# Patient Record
Sex: Female | Born: 1965 | Race: Black or African American | Hispanic: No | Marital: Single | Smoking: Never smoker
Health system: Southern US, Community
[De-identification: ages and names within clinical notes are randomized; demographics above are authoritative.]

## PROBLEM LIST (undated history)

## (undated) ENCOUNTER — Emergency Department (HOSPITAL_BASED_OUTPATIENT_CLINIC_OR_DEPARTMENT_OTHER): Admission: EM | Payer: Self-pay | Source: Home / Self Care

## (undated) HISTORY — PX: APPENDECTOMY: SHX54

## (undated) HISTORY — PX: OTHER SURGICAL HISTORY: SHX169

---

## 2012-07-27 ENCOUNTER — Ambulatory Visit: Payer: Self-pay | Admitting: Sports Medicine

## 2012-07-28 ENCOUNTER — Ambulatory Visit: Payer: Self-pay | Admitting: Sports Medicine

## 2012-08-01 ENCOUNTER — Ambulatory Visit: Payer: Self-pay | Admitting: Sports Medicine

## 2012-08-04 ENCOUNTER — Encounter: Payer: Self-pay | Admitting: Sports Medicine

## 2012-08-04 ENCOUNTER — Ambulatory Visit (INDEPENDENT_AMBULATORY_CARE_PROVIDER_SITE_OTHER): Payer: Federal, State, Local not specified - PPO | Admitting: Sports Medicine

## 2012-08-04 VITALS — BP 102/69 | HR 66 | Wt 136.0 lb

## 2012-08-04 DIAGNOSIS — Z01818 Encounter for other preprocedural examination: Secondary | ICD-10-CM

## 2012-08-04 DIAGNOSIS — Z299 Encounter for prophylactic measures, unspecified: Secondary | ICD-10-CM | POA: Insufficient documentation

## 2012-08-04 LAB — CBC
HCT: 37.5 % (ref 36.0–46.0)
Hemoglobin: 13.3 g/dL (ref 12.0–15.0)
MCH: 31.3 pg (ref 26.0–34.0)
MCHC: 35.5 g/dL (ref 30.0–36.0)
MCV: 88.2 fL (ref 78.0–100.0)
Platelets: 309 10*3/uL (ref 150–400)
RBC: 4.25 MIL/uL (ref 3.87–5.11)
RDW: 13.6 % (ref 11.5–15.5)
WBC: 5 10*3/uL (ref 4.0–10.5)

## 2012-08-04 LAB — COMPREHENSIVE METABOLIC PANEL WITH GFR
AST: 20 U/L (ref 0–37)
Alkaline Phosphatase: 47 U/L (ref 39–117)
Glucose, Bld: 79 mg/dL (ref 70–99)
Sodium: 139 meq/L (ref 135–145)
Total Bilirubin: 0.3 mg/dL (ref 0.3–1.2)
Total Protein: 6.9 g/dL (ref 6.0–8.3)

## 2012-08-04 LAB — TSH: TSH: 1.375 u[IU]/mL (ref 0.350–4.500)

## 2012-08-04 LAB — LIPID PANEL
Cholesterol: 190 mg/dL (ref 0–200)
HDL: 65 mg/dL (ref >39)
LDL Cholesterol: 110 mg/dL — ABNORMAL HIGH (ref 0–99)
Total CHOL/HDL Ratio: 2.9 Ratio
Triglycerides: 75 mg/dL (ref <150)
VLDL: 15 mg/dL (ref 0–40)

## 2012-08-04 LAB — COMPREHENSIVE METABOLIC PANEL
ALT: 12 U/L (ref 0–35)
Albumin: 4.3 g/dL (ref 3.5–5.2)
BUN: 10 mg/dL (ref 6–23)
CO2: 30 mEq/L (ref 19–32)
Calcium: 9.9 mg/dL (ref 8.4–10.5)
Chloride: 103 mEq/L (ref 96–112)
Creat: 0.83 mg/dL (ref 0.50–1.10)
Potassium: 4.2 mEq/L (ref 3.5–5.3)

## 2012-08-04 LAB — FOLATE: Folate: >20 ng/mL

## 2012-08-04 LAB — IRON AND TIBC
%SAT: 23 % (ref 20–55)
Iron: 70 ug/dL (ref 42–145)
TIBC: 308 ug/dL (ref 250–470)
UIBC: 238 ug/dL (ref 125–400)

## 2012-08-04 LAB — VITAMIN B12: Vitamin B-12: 660 pg/mL (ref 211–911)

## 2012-08-04 LAB — FERRITIN: Ferritin: 71 ng/mL (ref 10–291)

## 2012-08-04 NOTE — Progress Notes (Signed)
Subjective:    CC: Establish care.   HPI: Alicia Morrison is a very pleasant 46 year old female who I met at the [employer] health care. She is very healthy, and has no medical problems, she does anticipate going to the [place] for a tummy tuck surgery. She does note that her surgeon would like iron studies. She has no other complaints.  Preventative measures: Up-to-date on Pap smear earlier this year, tetanus shot in 2007, declines influenza vaccination.  Past medical history, Surgical history, Family history, Social history, Allergies, and medications have been entered into the medical record, reviewed, and no changes needed.   Review of Systems: No headache, visual changes, nausea, vomiting, diarrhea, constipation, dizziness, abdominal pain, skin rash, fevers, chills, night sweats, swollen lymph nodes, weight loss, chest pain, body aches, joint swelling, muscle aches, shortness of breath, mood changes, visual or auditory hallucinations.  Objective:    General: Well Developed, well nourished, and in no acute distress.  Neuro: Alert and oriented x3, extra-ocular muscles intact.  HEENT: Normocephalic, atraumatic, pupils equal round reactive to light, neck supple, no masses, no lymphadenopathy, thyroid nonpalpable.  Skin: Warm and dry, no rashes noted.  Cardiac: Regular rate and rhythm, no murmurs rubs or gallops.  Respiratory: Clear to auscultation bilaterally. Not using accessory muscles, speaking in full sentences.  Abdominal: Soft, nontender, nondistended, positive bowel sounds, no masses, no organomegaly.  Musculoskeletal: Shoulder, elbow, wrist, hip, knee, ankle stable, and with full range of motion.  Impression and Recommendations:    The patient was counselled, risk factors were discussed, anticipatory guidance given.

## 2012-08-04 NOTE — Assessment & Plan Note (Signed)
Checking TSH, CBC, CMET, iron studies. She is going for abdominoplasty surgery in the [place].  She will be there for one week, and return here for further followup.

## 2012-08-04 NOTE — Assessment & Plan Note (Signed)
Up-to-date on all preventive measures, Tdap in 2007, declines influenza vaccination. Checking lipids.

## 2012-11-07 ENCOUNTER — Emergency Department (HOSPITAL_BASED_OUTPATIENT_CLINIC_OR_DEPARTMENT_OTHER)

## 2012-11-07 ENCOUNTER — Emergency Department (HOSPITAL_BASED_OUTPATIENT_CLINIC_OR_DEPARTMENT_OTHER)
Admission: EM | Admit: 2012-11-07 | Discharge: 2012-11-07 | Disposition: A | Discharge status: Home / Self Care | Attending: Emergency Medicine | Admitting: Emergency Medicine

## 2012-11-07 ENCOUNTER — Encounter (HOSPITAL_BASED_OUTPATIENT_CLINIC_OR_DEPARTMENT_OTHER): Payer: Self-pay | Admitting: *Deleted

## 2012-11-07 DIAGNOSIS — S53401A Unspecified sprain of right elbow, initial encounter: Secondary | ICD-10-CM

## 2012-11-07 DIAGNOSIS — Y9389 Activity, other specified: Secondary | ICD-10-CM | POA: Insufficient documentation

## 2012-11-07 DIAGNOSIS — Y990 Civilian activity done for income or pay: Secondary | ICD-10-CM | POA: Insufficient documentation

## 2012-11-07 DIAGNOSIS — W208XXA Other cause of strike by thrown, projected or falling object, initial encounter: Secondary | ICD-10-CM | POA: Insufficient documentation

## 2012-11-07 DIAGNOSIS — Y9289 Other specified places as the place of occurrence of the external cause: Secondary | ICD-10-CM | POA: Insufficient documentation

## 2012-11-07 DIAGNOSIS — S59901A Unspecified injury of right elbow, initial encounter: Secondary | ICD-10-CM

## 2012-11-07 DIAGNOSIS — IMO0002 Reserved for concepts with insufficient information to code with codable children: Secondary | ICD-10-CM | POA: Insufficient documentation

## 2012-11-07 MED ORDER — IBUPROFEN 800 MG PO TABS
800.0000 mg | ORAL_TABLET | Freq: Once | ORAL | Status: AC
  Administered 2012-11-07: 800 mg via ORAL
  Filled 2012-11-07: qty 1

## 2012-11-07 NOTE — ED Notes (Signed)
PA at bedside.

## 2012-11-07 NOTE — ED Provider Notes (Signed)
History     CSN: [id number]  Arrival date & time 11/07/12  2043   First MD Initiated Contact with Patient 11/07/12 2052      Chief Complaint  Patient presents with  . Elbow Injury    (Consider location/radiation/quality/duration/timing/severity/associated sxs/prior treatment) HPI Comments: 47 year old female presents the emergency department complaining of right elbow pain x4 days after a heavy metal object fell onto her elbow while at work 4 days ago. States she was pulling something off of the shelves when the object fell on her elbow. She initially felt fine, however pain increased over the weekend and has been bothering her since. Describes the pain as sharp and aching, rated 10 out of 10. She has tried "wording hard" on her elbow without relief. Certain movements of her elbow makes the pain worse. Denies bruising, numbness or tingling.  The history is provided by the patient.    History reviewed. No pertinent past medical history.  Past Surgical History  Procedure Laterality Date  . Cesarean section      No family history on file.  History  Substance Use Topics  . Smoking status: Never Smoker   . Smokeless tobacco: Not on file  . Alcohol Use: No    OB History   Grav Para Term Preterm Abortions TAB SAB Ect Mult Living                  Review of Systems  Musculoskeletal: Positive for arthralgias (positive for right elbow pain).  Neurological: Negative for numbness.  All other systems reviewed and are negative.    Allergies  Review of patient's allergies indicates no known allergies.  Home Medications  No current outpatient prescriptions on file.  BP 100/76  Pulse 72  Temp(Src) 98.6 F (37 C) (Oral)  Resp 18  Ht 5' (1.524 m)  Wt 140 lb (63.504 kg)  BMI 27.34 kg/m2  SpO2 100%  LMP 11/04/2012  Physical Exam  Nursing note and vitals reviewed. Constitutional: She is oriented to person, place, and time. She appears well-developed and well-nourished. No  distress.  HENT:  Head: Normocephalic and atraumatic.  Mouth/Throat: Oropharynx is clear and moist.  Eyes: Conjunctivae and EOM are normal.  Neck: Normal range of motion. Neck supple.  Cardiovascular: Normal rate, regular rhythm and normal heart sounds.   Pulmonary/Chest: Effort normal and breath sounds normal. No respiratory distress.  Musculoskeletal: Normal range of motion. She exhibits no edema.  Full R elbow ROM. No bruising, ecchymosis or swelling. No deformity. TTP of lateral epicondyle.   Neurological: She is alert and oriented to person, place, and time. She has normal strength. No sensory deficit.  Skin: Skin is warm, dry and intact.  Psychiatric: She has a normal mood and affect. Her behavior is normal.    ED Course  Procedures (including critical care time)  Labs Reviewed - No data to display Dg Elbow Complete Right  11/07/2012  *RADIOLOGY REPORT*  Clinical Data: Right elbow injury at work 3 days ago.  Persistent right posterior elbow pain.  RIGHT ELBOW - COMPLETE 3+ VIEW  Comparison: None.  Findings: The right elbow appears intact.  No evidence of acute fracture or subluxation.  No significant effusion.  Bone cortex and trabecular architecture appear intact.  No focal bone lesion or bone destruction.  No abnormal periosteal reaction.  No radiopaque soft tissue foreign bodies.  IMPRESSION: No acute bony abnormalities appreciated.   Original Report Authenticated By: Burman Nieves, M.D.      1. Elbow sprain,  right, initial encounter   2. Elbow injury, right, initial encounter       MDM  47 y/o female with right elbow sprain/injury. No acute bony abnormality seen on xray. Full elbow ROM. Neurovascularly intact. Sling provided for comfort. RICE discussed. Advised ibuprofen for pain. Patient states understanding of plan and is agreeable.         Trevor Mace, PA-C 11/07/12 2138

## 2012-11-07 NOTE — ED Notes (Signed)
Per Devona pt's supervisor [place] [employer] no drug screen needed on the pt. Notified RN of this.

## 2012-11-07 NOTE — ED Notes (Signed)
Blunt force injury at the [employer] while at work. Right elbow injury. Metal object fell on her arm.

## 2012-11-07 NOTE — ED Provider Notes (Signed)
Medical screening examination/treatment/procedure(s) were performed by non-physician practitioner and as supervising physician I was immediately available for consultation/collaboration.   Gwyneth Sprout, MD 11/07/12 [phone]

## 2012-11-08 ENCOUNTER — Encounter: Payer: Self-pay | Admitting: Sports Medicine

## 2012-11-09 ENCOUNTER — Ambulatory Visit (INDEPENDENT_AMBULATORY_CARE_PROVIDER_SITE_OTHER): Payer: Worker's Compensation | Admitting: Sports Medicine

## 2012-11-09 ENCOUNTER — Encounter: Payer: Self-pay | Admitting: Sports Medicine

## 2012-11-09 VITALS — BP 100/60 | HR 68 | Wt 140.0 lb

## 2012-11-09 DIAGNOSIS — M7710 Lateral epicondylitis, unspecified elbow: Secondary | ICD-10-CM

## 2012-11-09 DIAGNOSIS — M25521 Pain in right elbow: Secondary | ICD-10-CM | POA: Insufficient documentation

## 2012-11-09 DIAGNOSIS — S5000XA Contusion of unspecified elbow, initial encounter: Secondary | ICD-10-CM

## 2012-11-09 MED ORDER — MELOXICAM 15 MG PO TABS: ORAL_TABLET | ORAL | Status: DC

## 2012-11-09 NOTE — Assessment & Plan Note (Addendum)
After blunt trauma to the right elbow there is some swelling. She also has some pain at the origin of the common extensor tendon. And is a combination of contusion and lateral epicondylitis. I'm going to treat her with compression (Elbow was strapped with compressive dressing), icing, Mobic, a few days off of work, x-rays. She'll also do home rehabilitation exercises. I like to see her back in 4 weeks for this, we can consider tennis elbow injection if no better.  She is workers comp, and will give [place] her workers comp information at some point in the future.

## 2012-11-09 NOTE — Progress Notes (Signed)
  Subjective:    CC: Right elbow injury  HPI: Alicia Morrison unfortunately had a pole bump her right elbow on March 31. She had pain and swelling, went to the emergency department where x-rays were done and were negative. Since then pain has been improving but she does have some swelling over the olecranon, and some pain over the radial head and lateral upper condyle. Pain is localized, doesn't radiate, mild to moderate.  Of note, she did not go to the [place] for her abdominoplasty.  Past medical history, Surgical history, Family history not pertinant except as noted below, Social history, Allergies, and medications have been entered into the medical record, reviewed, and no changes needed.   Review of Systems: No fevers, chills, night sweats, weight loss, chest pain, or shortness of breath.   Objective:    General: Well Developed, well nourished, and in no acute distress.  Neuro: Alert and oriented x3, extra-ocular muscles intact, sensation grossly intact.  HEENT: Normocephalic, atraumatic, pupils equal round reactive to light, neck supple, no masses, no lymphadenopathy, thyroid nonpalpable.  Skin: Warm and dry, no rashes. Cardiac: Regular rate and rhythm, no murmurs rubs or gallops.  Respiratory: Clear to auscultation bilaterally. Not using accessory muscles, speaking in full sentences. Right Elbow: Mild fullness over the proximal ulna. Range of motion full pronation, supination, flexion, extension. Strength is full to all of the above directions Stable to varus, valgus stress. Negative moving valgus stress test. Tender to palpation over this area of fullness on the ulna as well as over the lateral upper condyle. Ulnar nerve does not sublux. Negative cubital tunnel Tinel's.  X-rays were reviewed and are negative for fracture or dislocation.  Impression and Recommendations:

## 2012-12-05 ENCOUNTER — Emergency Department (HOSPITAL_BASED_OUTPATIENT_CLINIC_OR_DEPARTMENT_OTHER)
Admission: EM | Admit: 2012-12-05 | Discharge: 2012-12-05 | Disposition: A | Discharge status: Home / Self Care | Attending: Emergency Medicine | Admitting: Emergency Medicine

## 2012-12-05 ENCOUNTER — Encounter (HOSPITAL_BASED_OUTPATIENT_CLINIC_OR_DEPARTMENT_OTHER): Payer: Self-pay | Admitting: *Deleted

## 2012-12-05 DIAGNOSIS — R296 Repeated falls: Secondary | ICD-10-CM | POA: Insufficient documentation

## 2012-12-05 DIAGNOSIS — S61209A Unspecified open wound of unspecified finger without damage to nail, initial encounter: Secondary | ICD-10-CM | POA: Insufficient documentation

## 2012-12-05 DIAGNOSIS — S61212A Laceration without foreign body of right middle finger without damage to nail, initial encounter: Secondary | ICD-10-CM

## 2012-12-05 DIAGNOSIS — Y9389 Activity, other specified: Secondary | ICD-10-CM | POA: Insufficient documentation

## 2012-12-05 DIAGNOSIS — Y990 Civilian activity done for income or pay: Secondary | ICD-10-CM | POA: Insufficient documentation

## 2012-12-05 DIAGNOSIS — Y9289 Other specified places as the place of occurrence of the external cause: Secondary | ICD-10-CM | POA: Insufficient documentation

## 2012-12-05 DIAGNOSIS — Z791 Long term (current) use of non-steroidal anti-inflammatories (NSAID): Secondary | ICD-10-CM | POA: Insufficient documentation

## 2012-12-05 NOTE — ED Notes (Signed)
PA at bedside.

## 2012-12-05 NOTE — ED Notes (Signed)
Laceration to her right middle finger. Shelf fell at her job. States she does not need a drug screen.

## 2012-12-05 NOTE — ED Provider Notes (Signed)
History     CSN: [id number]  Arrival date & time 12/05/12  2054   First MD Initiated Contact with Patient 12/05/12 2313      Chief Complaint  Patient presents with  . Extremity Laceration    (Consider location/radiation/quality/duration/timing/severity/associated sxs/prior treatment) HPI  47 year old female presents for evaluations of right middle finger injury. Pt report while a work a shelf fell and accidentally cut his R middle finger.  Report noticing a small skin tear but there were moderate amount of bleeding which concerns pt.  Pain is sharp, throbbing, 4/10, non radiating.  Able to move finger, no numbness.  Not on any blood thinner, tetanus UTD.  No other injury.    History reviewed. No pertinent past medical history.  Past Surgical History  Procedure Laterality Date  . Appendectomy    . Cesarean section      Family History  Problem Relation Age of Onset  . Stroke Maternal Grandfather     History  Substance Use Topics  . Smoking status: Never Smoker   . Smokeless tobacco: Not on file  . Alcohol Use: No    OB History   Grav Para Term Preterm Abortions TAB SAB Ect Mult Living                  Review of Systems  Constitutional: Negative for fever.  Skin: Positive for wound.  Neurological: Negative for numbness.    Allergies  Review of patient's allergies indicates no known allergies.  Home Medications   Current Outpatient Rx  Name  Route  Sig  Dispense  Refill  . meloxicam (MOBIC) 15 MG tablet      One tab PO qAM with breakfast for 2 weeks, then daily prn pain.   30 tablet   3     BP 111/67  Pulse 70  Temp(Src) 97.5 F (36.4 C) (Oral)  Resp 18  Wt 140 lb (63.504 kg)  BMI 27.34 kg/m2  SpO2 98%  LMP 11/04/2012  Physical Exam  Nursing note and vitals reviewed. Constitutional: She appears well-developed and well-nourished. No distress.  HENT:  Head: Atraumatic.  Eyes: Conjunctivae are normal.  Neck: Neck supple.  Musculoskeletal:  She exhibits tenderness (R middle finger: a small  2mm superficial skin tear noted to proximal aspect on dorsum of finger.  FROM, no deformity, not actively bleeding, sensation intact, brisk cap refill.  normal grip.  FROM.  ).  Neurological: She is alert.  Skin: Skin is warm. No rash noted.    ED Course  Procedures (including critical care time)  Patient suffered a very small superficial laceration not amenable for suture. Wound were irrigated, Steri-Strip applied. I do not suspect any fracture. X-ray offered, patient declined. Care instruction provided.  LACERATION REPAIR Performed by: Fayrene Helper Authorized byFayrene Helper Consent: Verbal consent obtained. Risks and benefits: risks, benefits and alternatives were discussed Consent given by: patient Patient identity confirmed: provided demographic data Prepped and Draped in normal sterile fashion Wound explored  Laceration Location: R middle finger  Laceration Length: 0.2 cm  No Foreign Bodies seen or palpated  Anesthesia: none  Local anesthetic: none  Anesthetic total: none  Irrigation method: sterile water Amount of cleaning: standard  Skin closure: sterile strip  Number of sutures: sterile strip  Technique: sterile strip  Patient tolerance: Patient tolerated the procedure well with no immediate complications.   Labs Reviewed - No data to display No results found.   1. Laceration of right middle finger w/o foreign body w/o damage  to nail, initial encounter       MDM  BP 111/67  Pulse 70  Temp(Src) 97.5 F (36.4 C) (Oral)  Resp 18  Wt 140 lb (63.504 kg)  BMI 27.34 kg/m2  SpO2 98%  LMP 11/04/2012          Fayrene Helper, PA-C 12/05/12 2340

## 2012-12-06 NOTE — ED Provider Notes (Signed)
Medical screening examination/treatment/procedure(s) were performed by non-physician practitioner and as supervising physician I was immediately available for consultation/collaboration.  Jasmine Awe, MD 12/06/12 [phone]

## 2014-04-04 ENCOUNTER — Encounter (HOSPITAL_BASED_OUTPATIENT_CLINIC_OR_DEPARTMENT_OTHER): Payer: Self-pay | Admitting: Emergency Medicine

## 2014-04-04 ENCOUNTER — Emergency Department (HOSPITAL_BASED_OUTPATIENT_CLINIC_OR_DEPARTMENT_OTHER)
Admission: EM | Admit: 2014-04-04 | Discharge: 2014-04-04 | Disposition: A | Discharge status: Home / Self Care | Attending: Emergency Medicine | Admitting: Emergency Medicine

## 2014-04-04 DIAGNOSIS — Y9229 Other specified public building as the place of occurrence of the external cause: Secondary | ICD-10-CM | POA: Diagnosis not present

## 2014-04-04 DIAGNOSIS — IMO0002 Reserved for concepts with insufficient information to code with codable children: Secondary | ICD-10-CM | POA: Insufficient documentation

## 2014-04-04 DIAGNOSIS — X503XXA Overexertion from repetitive movements, initial encounter: Secondary | ICD-10-CM | POA: Insufficient documentation

## 2014-04-04 DIAGNOSIS — S335XXA Sprain of ligaments of lumbar spine, initial encounter: Secondary | ICD-10-CM | POA: Insufficient documentation

## 2014-04-04 DIAGNOSIS — S39012A Strain of muscle, fascia and tendon of lower back, initial encounter: Secondary | ICD-10-CM

## 2014-04-04 DIAGNOSIS — X58XXXA Exposure to other specified factors, initial encounter: Secondary | ICD-10-CM

## 2014-04-04 DIAGNOSIS — Y990 Civilian activity done for income or pay: Secondary | ICD-10-CM | POA: Diagnosis not present

## 2014-04-04 DIAGNOSIS — Y9389 Activity, other specified: Secondary | ICD-10-CM | POA: Insufficient documentation

## 2014-04-04 MED ORDER — CYCLOBENZAPRINE HCL 10 MG PO TABS: 10.0000 mg | ORAL_TABLET | Freq: Two times a day (BID) | ORAL | Status: DC | PRN

## 2014-04-04 MED ORDER — NAPROXEN 500 MG PO TABS: 500.0000 mg | ORAL_TABLET | Freq: Two times a day (BID) | ORAL | Status: DC

## 2014-04-04 MED ORDER — HYDROCODONE-ACETAMINOPHEN 5-325 MG PO TABS
2.0000 | ORAL_TABLET | Freq: Once | ORAL | Status: AC
  Administered 2014-04-04: 2 via ORAL
  Filled 2014-04-04: qty 2

## 2014-04-04 MED ORDER — CYCLOBENZAPRINE HCL 10 MG PO TABS
5.0000 mg | ORAL_TABLET | Freq: Once | ORAL | Status: AC
  Administered 2014-04-04: 5 mg via ORAL
  Filled 2014-04-04: qty 1

## 2014-04-04 MED ORDER — HYDROCODONE-ACETAMINOPHEN 5-325 MG PO TABS: 1.0000 | ORAL_TABLET | Freq: Four times a day (QID) | ORAL | Status: DC | PRN

## 2014-04-04 NOTE — ED Notes (Signed)
Pt. Works at [employer]. Was lifting at work this AM and reports feeling immediate pain, Stating she felt that she pulled something in her back. 10/10 pain.

## 2014-04-04 NOTE — Discharge Instructions (Signed)

## 2014-04-04 NOTE — ED Provider Notes (Signed)
CSN: [id number]     Arrival date & time 04/04/14  1048 History   First MD Initiated Contact with Patient 04/04/14 1116     Chief Complaint  Patient presents with  . Back Pain     (Consider location/radiation/quality/duration/timing/severity/associated sxs/prior Treatment) Patient is a 48 y.o. female presenting with back pain. The history is provided by the patient.  Back Pain Location:  Lumbar spine Quality:  Aching Radiates to:  Does not radiate Pain severity:  Severe Pain is:  Same all the time Onset quality:  Sudden Duration:  6 hours Timing:  Constant Progression:  Unchanged Chronicity:  New Context: physical stress   Context comment:  Lifint out a heavy drawer Relieved by:  Nothing Worsened by:  Movement Ineffective treatments:  None tried Associated symptoms: no abdominal pain, no abdominal swelling, no bladder incontinence, no bowel incontinence, no chest pain, no dysuria, no fever, no headaches, no numbness, no paresthesias, no pelvic pain, no perianal numbness, no tingling and no weakness     History reviewed. No pertinent past medical history. Past Surgical History  Procedure Laterality Date  . Appendectomy    . Cesarean section     Family History  Problem Relation Age of Onset  . Stroke Maternal Grandfather    History  Substance Use Topics  . Smoking status: Never Smoker   . Smokeless tobacco: Not on file  . Alcohol Use: No   OB History   Grav Para Term Preterm Abortions TAB SAB Ect Mult Living                 Review of Systems  Constitutional: Negative for fever, chills, diaphoresis, activity change, appetite change and fatigue.  HENT: Negative for congestion, facial swelling, rhinorrhea and sore throat.   Eyes: Negative for photophobia and discharge.  Respiratory: Negative for cough, chest tightness and shortness of breath.   Cardiovascular: Negative for chest pain, palpitations and leg swelling.  Gastrointestinal: Negative for nausea, vomiting,  abdominal pain, diarrhea and bowel incontinence.  Endocrine: Negative for polydipsia and polyuria.  Genitourinary: Negative for bladder incontinence, dysuria, frequency, difficulty urinating and pelvic pain.  Musculoskeletal: Positive for back pain. Negative for arthralgias, neck pain and neck stiffness.  Skin: Negative for color change and wound.  Allergic/Immunologic: Negative for immunocompromised state.  Neurological: Negative for tingling, facial asymmetry, weakness, numbness, headaches and paresthesias.  Hematological: Does not bruise/bleed easily.  Psychiatric/Behavioral: Negative for confusion and agitation.      Allergies  Review of patient's allergies indicates no known allergies.  Home Medications   Prior to Admission medications   Medication Sig Start Date End Date Taking? Authorizing Provider  cyclobenzaprine (FLEXERIL) 10 MG tablet Take 1 tablet (10 mg total) by mouth 2 (two) times daily as needed for muscle spasms. 04/04/14   Toy Cookey, MD  HYDROcodone-acetaminophen (NORCO/VICODIN) 5-325 MG per tablet Take 1 tablet by mouth every 6 (six) hours as needed for severe pain (Not controlled by naproxen). 04/04/14   Toy Cookey, MD  meloxicam (MOBIC) 15 MG tablet One tab PO qAM with breakfast for 2 weeks, then daily prn pain. 11/09/12   Monica Becton, MD  naproxen (NAPROSYN) 500 MG tablet Take 1 tablet (500 mg total) by mouth 2 (two) times daily with a meal. 04/04/14   Toy Cookey, MD   BP 109/65  Pulse 51  Temp(Src) 97.8 F (36.6 C) (Oral)  Resp 14  Ht 5' (1.524 m)  Wt 141 lb (63.957 kg)  BMI 27.54 kg/m2  SpO2 100%  LMP 03/14/2014 Physical Exam  Constitutional: She is oriented to person, place, and time. She appears well-developed and well-nourished. No distress.  HENT:  Head: Normocephalic and atraumatic.  Mouth/Throat: No oropharyngeal exudate.  Eyes: Pupils are equal, round, and reactive to light.  Neck: Normal range of motion. Neck supple.    Cardiovascular: Normal rate, regular rhythm and normal heart sounds.  Exam reveals no gallop and no friction rub.   No murmur heard. Pulmonary/Chest: Effort normal and breath sounds normal. No respiratory distress. She has no wheezes. She has no rales.  Abdominal: Soft. Bowel sounds are normal. She exhibits no distension and no mass. There is no tenderness. There is no rebound and no guarding.  Musculoskeletal: Normal range of motion. She exhibits no edema and no tenderness.       Back:  Neurological: She is alert and oriented to person, place, and time.  Skin: Skin is warm and dry.  Psychiatric: She has a normal mood and affect.    ED Course  Procedures (including critical care time) Labs Review Labs Reviewed - No data to display  Imaging Review No results found.   EKG Interpretation None      MDM   Final diagnoses:  Lumbar strain, initial encounter    Pt is a 48 y.o. female with Pmhx as above who presents with sudden onset L sided low back pain after lifing out a draw at work around 5:30am.  No fall, no focal numbness, weakness, bowel or bladder oncontinence, no midline pain. No imaging needed as I suspt muscular injury. Will d/c home w/ flexeril, naproxen, short course of norco for breakthrough pain. Return precautions given for new or worsening symptoms including worsening pain, numbness, weakness, bowel or bladder incontinence. ,         Toy Cookey, MD 04/04/14 1154

## 2014-06-11 ENCOUNTER — Ambulatory Visit: Payer: Self-pay | Admitting: Sports Medicine

## 2014-06-12 ENCOUNTER — Ambulatory Visit: Payer: Self-pay | Admitting: Sports Medicine

## 2014-06-14 ENCOUNTER — Ambulatory Visit (INDEPENDENT_AMBULATORY_CARE_PROVIDER_SITE_OTHER): Payer: Federal, State, Local not specified - PPO | Admitting: Sports Medicine

## 2014-06-14 ENCOUNTER — Encounter: Payer: Self-pay | Admitting: Sports Medicine

## 2014-06-14 VITALS — BP 95/61 | HR 70 | Ht 61.0 in | Wt 145.0 lb

## 2014-06-14 DIAGNOSIS — R51 Headache: Secondary | ICD-10-CM

## 2014-06-14 DIAGNOSIS — T169XXA Foreign body in ear, unspecified ear, initial encounter: Secondary | ICD-10-CM | POA: Diagnosis not present

## 2014-06-14 DIAGNOSIS — R519 Headache, unspecified: Secondary | ICD-10-CM | POA: Insufficient documentation

## 2014-06-14 MED ORDER — ELETRIPTAN HYDROBROMIDE 20 MG PO TABS: ORAL_TABLET | ORAL | Status: DC

## 2014-06-14 MED ORDER — TOPIRAMATE 50 MG PO TABS: ORAL_TABLET | ORAL | Status: DC

## 2014-06-14 NOTE — Progress Notes (Signed)
  Subjective:    CC: headaches  HPI: This is a pleasant 48 year old female, she complains of headaches for the past several years, on and off, they tend the last days, and are associated with photophobia and phonophobia. She tells me that she did have a brain tumor at a young age. Symptoms are moderate, persistent, nothing focal, no morning headaches.  Past medical history, Surgical history, Family history not pertinant except as noted below, Social history, Allergies, and medications have been entered into the medical record, reviewed, and no changes needed.   Review of Systems: No fevers, chills, night sweats, weight loss, chest pain, or shortness of breath.   Objective:    General: Well Developed, well nourished, and in no acute distress.  Neuro: Alert and oriented x3, extra-ocular muscles intact, sensation grossly intact. Cranial nerves II through XII are intact, motor, sensory, and coordinative functions are all intact. HEENT: Normocephalic, atraumatic, pupils equal round reactive to light, neck supple, no masses, no lymphadenopathy, thyroid nonpalpable. There are foreign bodies visible in both ear canals. Skin: Warm and dry, no rashes. Cardiac: Regular rate and rhythm, no murmurs rubs or gallops, no lower extremity edema.  Respiratory: Clear to auscultation bilaterally. Not using accessory muscles, speaking in full sentences.  Procedure:  Removal of bilateral external ear canal foreign body. Risks, benefits, alternatives explained to patient. Consent obtained. Time out conducted. Using alligator forceps I removed the foreign body from each ear canal, it appeared to be part of a blue nitrile glove. Canals are unremarkable and clear after foreign body removal. Advised to return if increased redness, swelling, drainage, fevers, or chills.  Impression and Recommendations:

## 2014-06-14 NOTE — Assessment & Plan Note (Signed)
Symptoms do sound predominantly migrainous. We did removed from both her ears that maybe a cause. She tells me she has a history of a brain tumor. I am going to order an MRI of her brain with and without IV contrast.  We will obtain renal function first. Return to go over MRI results. I'm also going to start Topamax and Relpax.

## 2014-06-14 NOTE — Assessment & Plan Note (Signed)
Removed to foreign bodies from each ear. They were pieces of nitrile gloves.

## 2014-06-15 LAB — BASIC METABOLIC PANEL WITH GFR
BUN: 10 mg/dL (ref 6–23)
Chloride: 105 meq/L (ref 96–112)
Glucose, Bld: 87 mg/dL (ref 70–99)
Potassium: 3.6 meq/L (ref 3.5–5.3)
Sodium: 140 meq/L (ref 135–145)

## 2014-06-15 LAB — BASIC METABOLIC PANEL
CO2: 28 mEq/L (ref 19–32)
Calcium: 9 mg/dL (ref 8.4–10.5)
Creat: 0.77 mg/dL (ref 0.50–1.10)

## 2014-06-25 ENCOUNTER — Ambulatory Visit (INDEPENDENT_AMBULATORY_CARE_PROVIDER_SITE_OTHER): Payer: Federal, State, Local not specified - PPO

## 2014-06-25 DIAGNOSIS — G43909 Migraine, unspecified, not intractable, without status migrainosus: Secondary | ICD-10-CM

## 2014-06-25 MED ORDER — GADOBENATE DIMEGLUMINE 529 MG/ML IV SOLN: 13.0000 mL | Freq: Once | INTRAVENOUS | Status: AC | PRN

## 2015-11-27 ENCOUNTER — Other Ambulatory Visit: Payer: Self-pay | Admitting: Orthopaedic Surgery

## 2015-11-27 DIAGNOSIS — M545 Low back pain: Secondary | ICD-10-CM

## 2015-11-30 ENCOUNTER — Ambulatory Visit
Admission: RE | Admit: 2015-11-30 | Discharge: 2015-11-30 | Disposition: A | Discharge status: Home / Self Care | Payer: Federal, State, Local not specified - PPO | Source: Ambulatory Visit | Attending: Orthopaedic Surgery | Admitting: Orthopaedic Surgery

## 2015-11-30 DIAGNOSIS — M545 Low back pain: Secondary | ICD-10-CM

## 2015-12-03 ENCOUNTER — Other Ambulatory Visit: Payer: Self-pay

## 2017-02-23 ENCOUNTER — Telehealth: Payer: Self-pay | Admitting: Sports Medicine

## 2017-02-23 NOTE — Telephone Encounter (Signed)
Patient called explaining that she had a bad cold and needed to establish care. I advised the patient that I could get her scheduled first thing tomorrow morning. Patient declined and stated that she would go to the ER if her symptoms get worse. Patient stated she would call back if she changed her mind.

## 2017-04-16 ENCOUNTER — Encounter (INDEPENDENT_AMBULATORY_CARE_PROVIDER_SITE_OTHER): Payer: Self-pay | Admitting: Orthopaedic Surgery

## 2017-04-16 ENCOUNTER — Ambulatory Visit (INDEPENDENT_AMBULATORY_CARE_PROVIDER_SITE_OTHER): Payer: Federal, State, Local not specified - PPO

## 2017-04-16 ENCOUNTER — Ambulatory Visit (INDEPENDENT_AMBULATORY_CARE_PROVIDER_SITE_OTHER): Payer: Federal, State, Local not specified - PPO | Admitting: Orthopaedic Surgery

## 2017-04-16 DIAGNOSIS — G8929 Other chronic pain: Secondary | ICD-10-CM | POA: Diagnosis not present

## 2017-04-16 DIAGNOSIS — M545 Low back pain: Secondary | ICD-10-CM

## 2017-04-16 DIAGNOSIS — M79671 Pain in right foot: Secondary | ICD-10-CM

## 2017-04-16 NOTE — Progress Notes (Signed)
   Office Visit Note   Patient: Alicia FootsBridget Beiser           Date of Birth: 23-May-1966           MRN: [id number] Visit Date: 04/16/2017              Requested by: Monica Bectonhekkekandam, Thomas J, MD [address] [place], [place] [id number] PCP: Monica Bectonhekkekandam, Thomas J, MD   Assessment & Plan: Visit Diagnoses:  1. Pain in right foot     Plan: Impression is lumbar radiculopathy. We will send her to Dr. Alvester MorinNewton for epidural steroid injection. She had a previous MRI in 2017 which is available for review.  Follow-Up Instructions: Return if symptoms worsen or fail to improve.   Orders:  Orders Placed This Encounter  Procedures  . XR Foot Complete Right   No orders of the defined types were placed in this encounter.     Procedures: No procedures performed   Clinical Data: No additional findings.   Subjective: Chief Complaint  Patient presents with  . Lower Back - Pain, Follow-up    Patient comes in today for burning right foot pain. I have seen her previously for lumbar radiculopathy. This still continues to be a problem.      Review of Systems  Constitutional: Negative.   HENT: Negative.   Eyes: Negative.   Respiratory: Negative.   Cardiovascular: Negative.   Endocrine: Negative.   Musculoskeletal: Negative.   Neurological: Negative.   Hematological: Negative.   Psychiatric/Behavioral: Negative.   All other systems reviewed and are negative.    Objective: Vital Signs: There were no vitals taken for this visit.  Physical Exam  Constitutional: She is oriented to person, place, and time. She appears well-developed and well-nourished.  Pulmonary/Chest: Effort normal.  Neurological: She is alert and oriented to person, place, and time.  Skin: Skin is warm. Capillary refill takes less than 2 seconds.  Psychiatric: She has a normal mood and affect. Her behavior is normal. Judgment and thought content normal.  Nursing note and vitals reviewed.   Ortho  Exam Right foot exam shows no swelling or skin changes. This is nontender to palpation. Normal motor and sensory function. Specialty Comments:  No specialty comments available.  Imaging: Xr Foot Complete Right  Result Date: 04/16/2017 No acute or structural abnormalities    PMFS History: Patient Active Problem List   Diagnosis Date Noted  . Foreign body in ear 06/14/2014  . Frequent headaches 06/14/2014  . Preventive measure 08/04/2012   No past medical history on file.  Family History  Problem Relation Age of Onset  . Stroke Maternal Grandfather     Past Surgical History:  Procedure Laterality Date  . APPENDECTOMY    . CESAREAN SECTION     Social History   Occupational History  . Not on file.   Social History Main Topics  . Smoking status: Never Smoker  . Smokeless tobacco: Never Used  . Alcohol use No  . Drug use: No  . Sexual activity: Yes

## 2017-04-16 NOTE — Addendum Note (Signed)
Addended by: Albertina ParrGARCIA, Marvis Saefong on: 04/16/2017 09:58 AM   Modules accepted: Orders

## 2017-04-29 ENCOUNTER — Encounter (INDEPENDENT_AMBULATORY_CARE_PROVIDER_SITE_OTHER): Payer: Federal, State, Local not specified - PPO | Admitting: Physical Medicine and Rehabilitation

## 2017-08-27 ENCOUNTER — Encounter (HOSPITAL_BASED_OUTPATIENT_CLINIC_OR_DEPARTMENT_OTHER): Payer: Self-pay

## 2017-08-27 ENCOUNTER — Emergency Department (HOSPITAL_BASED_OUTPATIENT_CLINIC_OR_DEPARTMENT_OTHER)
Admission: EM | Admit: 2017-08-27 | Discharge: 2017-08-28 | Disposition: A | Discharge status: Home / Self Care | Payer: Federal, State, Local not specified - PPO | Attending: Emergency Medicine | Admitting: Emergency Medicine

## 2017-08-27 ENCOUNTER — Other Ambulatory Visit: Payer: Self-pay

## 2017-08-27 DIAGNOSIS — M79671 Pain in right foot: Secondary | ICD-10-CM | POA: Diagnosis present

## 2017-08-27 DIAGNOSIS — M722 Plantar fascial fibromatosis: Secondary | ICD-10-CM | POA: Diagnosis not present

## 2017-08-27 NOTE — ED Triage Notes (Signed)
C/o pain to right foot x 3 weeks-denies injury-steady gait

## 2017-08-28 MED ORDER — TRAMADOL HCL 50 MG PO TABS: 50.0000 mg | ORAL_TABLET | Freq: Four times a day (QID) | ORAL | 0 refills | Status: AC | PRN

## 2017-08-28 MED ORDER — IBUPROFEN 800 MG PO TABS
800.0000 mg | ORAL_TABLET | Freq: Once | ORAL | Status: AC
  Administered 2017-08-28: 800 mg via ORAL
  Filled 2017-08-28: qty 1

## 2017-08-28 NOTE — Discharge Instructions (Signed)
You may alternate Tylenol 1000 mg every 6 hours as needed for pain and Ibuprofen 800 mg every 8 hours as needed for pain.  Please take Ibuprofen with food. ° °

## 2017-08-28 NOTE — ED Provider Notes (Signed)
TIME SEEN: 12:17 AM  CHIEF COMPLAINT: Right foot pain  HPI: Patient is a 52 year old female with no significant past medical history who presents to the emergency department with right foot pain that has been ongoing for the past month.  States she has not seen a doctor for this yet.  No known injury.  States pain is worse when she gets up in the morning and tries to walk.  States that she has to stand for long hours on a concrete floor at work.  No numbness, tingling or weakness.  No calf tenderness or swelling.  No redness or warmth.  No fever.  Tender mostly around the heel.  States she has been using a frozen water bottle to roll under her foot which does help her symptoms.  ROS: See HPI Constitutional: no fever  Eyes: no drainage  ENT: no runny nose   Cardiovascular:  no chest pain  Resp: no SOB  GI: no vomiting GU: no dysuria Integumentary: no rash  Allergy: no hives  Musculoskeletal: no leg swelling  Neurological: no slurred speech ROS otherwise negative  PAST MEDICAL HISTORY/PAST SURGICAL HISTORY:  History reviewed. No pertinent past medical history.  MEDICATIONS:  Prior to Admission medications   Not on File    ALLERGIES:  No Known Allergies  SOCIAL HISTORY:  Social History   Tobacco Use  . Smoking status: Never Smoker  . Smokeless tobacco: Never Used  Substance Use Topics  . Alcohol use: Yes    Comment: occ    FAMILY HISTORY: Family History  Problem Relation Age of Onset  . Stroke Maternal Grandfather     EXAM: BP 131/77 (BP Location: Right Arm)   Pulse 88   Temp 98.8 F (37.1 C) (Oral)   Resp 16   Ht 5' (1.524 m)   Wt 67.1 kg (148 lb)   LMP 03/14/2014   SpO2 100%   BMI 28.90 kg/m  CONSTITUTIONAL: Alert and oriented and responds appropriately to questions. Well-appearing; well-nourished HEAD: Normocephalic EYES: Conjunctivae clear, pupils appear equal, EOMI ENT: normal nose; moist mucous membranes NECK: Supple, no meningismus, no nuchal  rigidity, no LAD  CARD: RRR; S1 and S2 appreciated; no murmurs, no clicks, no rubs, no gallops RESP: Normal chest excursion without splinting or tachypnea; breath sounds clear and equal bilaterally; no wheezes, no rhonchi, no rales, no hypoxia or respiratory distress, speaking full sentences ABD/GI: Normal bowel sounds; non-distended; soft, non-tender, no rebound, no guarding, no peritoneal signs, no hepatosplenomegaly BACK:  The back appears normal and is non-tender to palpation, there is no CVA tenderness EXT: Tender to palpation over the insertion of the plantar fascia at the medial aspect of the right sole of the foot.  No redness, warmth, swelling or ecchymosis.  2+ DP pulses bilaterally.  No bony tenderness on exam.  No redness or warmth and compartments are soft.  Normal ROM in all joints; otherwise extremities are non-tender to palpation; no edema; normal capillary refill; no cyanosis, no calf tenderness or swelling, normal sensation throughout her right lower extremity    SKIN: Normal color for age and race; warm; no rash NEURO: Moves all extremities equally PSYCH: The patient's mood and manner are appropriate. Grooming and personal hygiene are appropriate.  MEDICAL DECISION MAKING: Patient here with what appears to be right plantar fasciitis.  No sign of gout, cellulitis, abscess, septic arthritis, DVT, arterial obstruction.  She is neurovascularly intact distally.  Recommended alternating Tylenol and Motrin, stretching, ice and elevation.  Recommended using an appropriate  brace to help keep her foot in dorsiflexion when sleeping.  Given podiatry follow-up information.  I feel she is safe for discharge.  I do not feel she needs emergent imaging and she agrees.   At this time, I do not feel there is any life-threatening condition present. I have reviewed and discussed all results (EKG, imaging, lab, urine as appropriate) and exam findings with patient/family. I have reviewed nursing notes and  appropriate previous records.  I feel the patient is safe to be discharged home without further emergent workup and can continue workup as an outpatient as needed. Discussed usual and customary return precautions. Patient/family verbalize understanding and are comfortable with this plan.  Outpatient follow-up has been provided if needed. All questions have been answered.     Ward, Layla Maw, DO 08/28/17 [phone]

## 2018-04-11 ENCOUNTER — Encounter (HOSPITAL_BASED_OUTPATIENT_CLINIC_OR_DEPARTMENT_OTHER): Payer: Self-pay | Admitting: *Deleted

## 2018-04-11 ENCOUNTER — Other Ambulatory Visit: Payer: Self-pay

## 2018-04-11 ENCOUNTER — Emergency Department (HOSPITAL_BASED_OUTPATIENT_CLINIC_OR_DEPARTMENT_OTHER)
Admission: EM | Admit: 2018-04-11 | Discharge: 2018-04-11 | Disposition: A | Discharge status: Home / Self Care | Payer: Federal, State, Local not specified - PPO | Attending: Emergency Medicine | Admitting: Emergency Medicine

## 2018-04-11 ENCOUNTER — Emergency Department (HOSPITAL_BASED_OUTPATIENT_CLINIC_OR_DEPARTMENT_OTHER): Payer: Federal, State, Local not specified - PPO

## 2018-04-11 DIAGNOSIS — Z5189 Encounter for other specified aftercare: Secondary | ICD-10-CM

## 2018-04-11 DIAGNOSIS — Z4800 Encounter for change or removal of nonsurgical wound dressing: Secondary | ICD-10-CM | POA: Diagnosis present

## 2018-04-11 NOTE — ED Provider Notes (Signed)
[facility] EMERGENCY DEPARTMENT Provider Note   CSN: [id number] Arrival date & time: 04/11/18  0044     History   Chief Complaint Chief Complaint  Patient presents with  . Wound Check    HPI Stacia Brickett is a 52 y.o. female.  HPI  This is a 52 year old female who presents with concern for foreign body in the abdomen.  Patient reports that she had an abdominoplasty on July 27.  She was advised by a masseuse to place a marble in her bellybutton to help with healing.  She states that she had a dressing over her bellybutton.  She went to remove the marble and noted it was not there.  She states that yesterday she had some left lower quadrant abdominal pain.  No nausea, vomiting, diarrhea.  Currently she is without pain.  She does not believe that the marble could have fallen out because "I had a dressing on it the whole time."  She denies any fevers or other symptoms.  History reviewed. No pertinent past medical history.  Patient Active Problem List   Diagnosis Date Noted  . Foreign body in ear 06/14/2014  . Frequent headaches 06/14/2014  . Preventive measure 08/04/2012    Past Surgical History:  Procedure Laterality Date  . APPENDECTOMY    . CESAREAN SECTION    . tummy tuck       OB History   None      Home Medications    Prior to Admission medications   Medication Sig Start Date End Date Taking? Authorizing Provider  traMADol (ULTRAM) 50 MG tablet Take 1 tablet (50 mg total) by mouth every 6 (six) hours as needed. 08/28/17   Ward, Layla Maw, DO    Family History Family History  Problem Relation Age of Onset  . Stroke Maternal Grandfather     Social History Social History   Tobacco Use  . Smoking status: Never Smoker  . Smokeless tobacco: Never Used  Substance Use Topics  . Alcohol use: Yes    Comment: occ  . Drug use: No     Allergies   Patient has no known allergies.   Review of Systems Review of Systems  Constitutional: Negative  for fever.  Respiratory: Negative for shortness of breath.   Cardiovascular: Negative for chest pain.  Gastrointestinal: Positive for abdominal pain. Negative for diarrhea, nausea and vomiting.  Genitourinary: Negative for dysuria.  All other systems reviewed and are negative.    Physical Exam Updated Vital Signs BP 114/82 (BP Location: Left Arm)   Pulse 70   Temp 98.5 F (36.9 C) (Oral)   Resp 16   Ht 5' (1.524 m)   Wt 63 kg   LMP 03/14/2014   SpO2 99%   BMI 27.15 kg/m   Physical Exam  Constitutional: She is oriented to person, place, and time. She appears well-developed and well-nourished. No distress.  HENT:  Head: Normocephalic and atraumatic.  Neck: Neck supple.  Cardiovascular: Normal rate and regular rhythm.  Pulmonary/Chest: Effort normal. No respiratory distress.  Abdominal: Soft. Bowel sounds are normal. There is no tenderness.  Well-healing abdominal scars over the lower abdomen as well as laparoscopic scars, no foreign body palpated, well-healed umbilical incision  Neurological: She is alert and oriented to person, place, and time.  Skin: Skin is warm and dry.  Psychiatric: She has a normal mood and affect.  Nursing note and vitals reviewed.    ED Treatments / Results  Labs (all labs ordered are  listed, but only abnormal results are displayed) Labs Reviewed - No data to display  EKG None  Radiology Dg Abdomen 1 View  Result Date: 04/11/2018 CLINICAL DATA:  52 year old female. Patient had tummy tuck on 03/05/2018 and put a flat marble in her belly button. When she took the bandages of, the marble was gone. Marble was flat and about the size of a dime. Patient states she feels pain in her left groin area. EXAM: ABDOMEN - 1 VIEW COMPARISON:  None. FINDINGS: There is moderate stool throughout the colon. No bowel dilatation or evidence of obstruction. No free air or radiopaque calculi. Multiple calcified fibroids noted in the pelvis. Bilateral tubal occlusive  devices noted. The osseous structures and soft tissues appear unremarkable. IMPRESSION: 1. Moderate colonic stool burden.  No evidence of bowel obstruction. 2. Calcified uterine fibroids and bilateral tubal occlusive devices. No other radiopaque foreign object. Electronically Signed   By: Elgie Collard M.D.   On: 04/11/2018 02:06    Procedures Procedures (including critical care time)  Medications Ordered in ED Medications - No data to display   Initial Impression / Assessment and Plan / ED Course  I have reviewed the triage vital signs and the nursing notes.  Pertinent labs & imaging results that were available during my care of the patient were reviewed by me and considered in my medical decision making (see chart for details).     Presents with concerns for marble within her umbilical wound.  She is overall nontoxic and vital signs are reassuring.  Her exam is very benign with well-healing incisions.  I discussed with the patient that I felt it was very low probability that the marble was now on her abdomen.  It is much more likely that it fell out.  Patient is adamant that she thinks that it is within her abdomen.  Will obtain a screening KUB.  She is without pain at this time and her abdominal exam is benign.  KUB does not show any evidence of foreign body.  She does have some constipation.  Patient was informed that her x-ray is reassuring.  After history, exam, and medical workup I feel the patient has been appropriately medically screened and is safe for discharge home. Pertinent diagnoses were discussed with the patient. Patient was given return precautions.   Final Clinical Impressions(s) / ED Diagnoses   Final diagnoses:  Visit for wound check    ED Discharge Orders    None       Shon Baton, MD 04/11/18 [phone]

## 2018-04-11 NOTE — Discharge Instructions (Addendum)
You do not have evidence of a marble in your abdomen.  Follow-up with your plastic surgeon as needed.

## 2018-04-11 NOTE — ED Triage Notes (Addendum)
Pt states she had a tummy tuck on July 24. She was told by her lymphatic masseuse to put a marble in her belly button to keep it from closing up. She placed the marble about 3 days ago and now she believes in may be in her abdomen

## 2018-05-18 ENCOUNTER — Other Ambulatory Visit: Payer: Self-pay | Admitting: Family Medicine

## 2019-05-04 ENCOUNTER — Encounter: Payer: Self-pay | Admitting: Orthopaedic Surgery

## 2019-05-05 ENCOUNTER — Ambulatory Visit: Payer: Federal, State, Local not specified - PPO | Admitting: Orthopaedic Surgery

## 2019-10-30 ENCOUNTER — Ambulatory Visit: Payer: Federal, State, Local not specified - PPO | Admitting: Psychology

## 2019-11-06 ENCOUNTER — Ambulatory Visit (INDEPENDENT_AMBULATORY_CARE_PROVIDER_SITE_OTHER): Payer: Federal, State, Local not specified - PPO | Admitting: Psychology

## 2019-11-06 DIAGNOSIS — F4323 Adjustment disorder with mixed anxiety and depressed mood: Secondary | ICD-10-CM

## 2019-11-13 ENCOUNTER — Ambulatory Visit (INDEPENDENT_AMBULATORY_CARE_PROVIDER_SITE_OTHER): Payer: Federal, State, Local not specified - PPO | Admitting: Psychology

## 2019-11-13 DIAGNOSIS — F4323 Adjustment disorder with mixed anxiety and depressed mood: Secondary | ICD-10-CM

## 2021-02-13 ENCOUNTER — Ambulatory Visit: Payer: Federal, State, Local not specified - PPO | Admitting: Orthopaedic Surgery

## 2021-02-19 ENCOUNTER — Ambulatory Visit: Payer: Self-pay

## 2021-02-19 ENCOUNTER — Other Ambulatory Visit: Payer: Self-pay

## 2021-02-19 ENCOUNTER — Ambulatory Visit: Payer: Federal, State, Local not specified - PPO | Admitting: Orthopaedic Surgery

## 2021-02-19 ENCOUNTER — Encounter: Payer: Self-pay | Admitting: Orthopaedic Surgery

## 2021-02-19 DIAGNOSIS — M545 Low back pain, unspecified: Secondary | ICD-10-CM | POA: Diagnosis not present

## 2021-02-19 MED ORDER — METHOCARBAMOL 500 MG PO TABS: 500.0000 mg | ORAL_TABLET | Freq: Four times a day (QID) | ORAL | 2 refills | Status: AC | PRN

## 2021-02-19 MED ORDER — PREDNISONE 10 MG (21) PO TBPK: ORAL_TABLET | ORAL | 0 refills | Status: AC

## 2021-02-19 NOTE — Progress Notes (Signed)
Office Visit Note   Patient: Alicia Morrison           Date of Birth: [birth year]           MRN: [id number] Visit Date: 02/19/2021              Requested by: Monica Becton, MD [address] [place],  [place] [address] PCP: Monica Becton, MD   Assessment & Plan: Visit Diagnoses:  1. Low back pain, unspecified back pain laterality, unspecified chronicity, unspecified whether sciatica present     Plan: Impression is exacerbation of low back pain.  X-rays are unremarkable.  Not getting the sense that she has a pinched nerve.  Sounds like she does have some periodic flareups and I think it might be good idea to consider allowing her to take up to 4 days off per month for those flareups.  I sent a prescription for prednisone and Robaxin to see if this will help her symptoms.  Otherwise we will see her back as needed.  Follow-Up Instructions: Return if symptoms worsen or fail to improve.   Orders:  Orders Placed This Encounter  Procedures  . XR Lumbar Spine 2-3 Views   Meds ordered this encounter  Medications  . predniSONE (STERAPRED UNI-PAK 21 TAB) 10 MG (21) TBPK tablet    Sig: Take as directed    Dispense:  21 tablet    Refill:  0  . methocarbamol (ROBAXIN) 500 MG tablet    Sig: Take 1 tablet (500 mg total) by mouth every 6 (six) hours as needed for muscle spasms.    Dispense:  30 tablet    Refill:  2      Procedures: No procedures performed   Clinical Data: No additional findings.   Subjective: Chief Complaint  Patient presents with  . Lower Back - Pain    Alicia Morrison returns today for recent worsening low back pain that started in February of this year when she bent down to pick up something and she felt something pop.  This occurred at work.  She works for the [employer].  She states that sometimes she has radiation of pain down the right leg denies any incontinence.  Denies any weakness.   Review of Systems  Constitutional: Negative.    HENT: Negative.    Eyes: Negative.   Respiratory: Negative.    Cardiovascular: Negative.   Endocrine: Negative.   Musculoskeletal: Negative.   Neurological: Negative.   Hematological: Negative.   Psychiatric/Behavioral: Negative.    All other systems reviewed and are negative.   Objective: Vital Signs: LMP 03/14/2014   Physical Exam Vitals and nursing note reviewed.  Constitutional:      Appearance: She is well-developed.  HENT:     Head: Normocephalic and atraumatic.  Pulmonary:     Effort: Pulmonary effort is normal.  Abdominal:     Palpations: Abdomen is soft.  Musculoskeletal:     Cervical back: Neck supple.  Skin:    General: Skin is warm.     Capillary Refill: Capillary refill takes less than 2 seconds.  Neurological:     Mental Status: She is alert and oriented to person, place, and time.  Psychiatric:        Behavior: Behavior normal.        Thought Content: Thought content normal.        Judgment: Judgment normal.    Ortho Exam Low back exam shows no palpable deformities.  Range of motion  is slightly limited secondary to discomfort.  No motor or sensory deficits of the lower extremities. Specialty Comments:  No specialty comments available.  Imaging: No results found.   PMFS History: Patient Active Problem List   Diagnosis Date Noted  . Foreign body in ear 06/14/2014  . Frequent headaches 06/14/2014  . Preventive measure 08/04/2012   History reviewed. No pertinent past medical history.  Family History  Problem Relation Age of Onset  . Stroke Maternal Grandfather     Past Surgical History:  Procedure Laterality Date  . APPENDECTOMY    . CESAREAN SECTION    . tummy tuck     Social History   Occupational History  . Not on file  Tobacco Use  . Smoking status: Never  . Smokeless tobacco: Never  Vaping Use  . Vaping Use: Never used  Substance and Sexual Activity  . Alcohol use: Yes    Comment: occ  . Drug use: No  . Sexual activity:  Not on file

## 2021-04-10 ENCOUNTER — Emergency Department (HOSPITAL_BASED_OUTPATIENT_CLINIC_OR_DEPARTMENT_OTHER): Payer: Federal, State, Local not specified - PPO

## 2021-04-10 ENCOUNTER — Emergency Department (HOSPITAL_BASED_OUTPATIENT_CLINIC_OR_DEPARTMENT_OTHER)
Admission: EM | Admit: 2021-04-10 | Discharge: 2021-04-10 | Disposition: A | Discharge status: Home / Self Care | Payer: Federal, State, Local not specified - PPO | Attending: Emergency Medicine | Admitting: Emergency Medicine

## 2021-04-10 ENCOUNTER — Emergency Department (HOSPITAL_BASED_OUTPATIENT_CLINIC_OR_DEPARTMENT_OTHER): Payer: Federal, State, Local not specified - PPO | Admitting: Radiology

## 2021-04-10 ENCOUNTER — Other Ambulatory Visit: Payer: Self-pay

## 2021-04-10 ENCOUNTER — Encounter (HOSPITAL_BASED_OUTPATIENT_CLINIC_OR_DEPARTMENT_OTHER): Payer: Self-pay

## 2021-04-10 DIAGNOSIS — R062 Wheezing: Secondary | ICD-10-CM | POA: Insufficient documentation

## 2021-04-10 DIAGNOSIS — R0609 Other forms of dyspnea: Secondary | ICD-10-CM | POA: Diagnosis not present

## 2021-04-10 DIAGNOSIS — R059 Cough, unspecified: Secondary | ICD-10-CM | POA: Insufficient documentation

## 2021-04-10 DIAGNOSIS — R0789 Other chest pain: Secondary | ICD-10-CM | POA: Diagnosis present

## 2021-04-10 LAB — BASIC METABOLIC PANEL
Anion gap: 9 (ref 5–15)
BUN: 12 mg/dL (ref 6–20)
CO2: 26 mmol/L (ref 22–32)
Calcium: 9.4 mg/dL (ref 8.9–10.3)
Chloride: 107 mmol/L (ref 98–111)
Creatinine, Ser: 0.8 mg/dL (ref 0.44–1.00)
GFR, Estimated: >60 mL/min (ref >60)
Glucose, Bld: 87 mg/dL (ref 70–99)
Potassium: 3.6 mmol/L (ref 3.5–5.1)
Sodium: 142 mmol/L (ref 135–145)

## 2021-04-10 LAB — CBC
HCT: 35.7 % — ABNORMAL LOW (ref 36.0–46.0)
Hemoglobin: 12.2 g/dL (ref 12.0–15.0)
MCH: 30.3 pg (ref 26.0–34.0)
MCHC: 34.2 g/dL (ref 30.0–36.0)
MCV: 88.6 fL (ref 80.0–100.0)
Platelets: 313 10*3/uL (ref 150–400)
RBC: 4.03 MIL/uL (ref 3.87–5.11)
RDW: 12.3 % (ref 11.5–15.5)
WBC: 7.5 10*3/uL (ref 4.0–10.5)
nRBC: 0 % (ref 0.0–0.2)

## 2021-04-10 LAB — TROPONIN I (HIGH SENSITIVITY): Troponin I (High Sensitivity): 3 ng/L (ref <18)

## 2021-04-10 MED ORDER — IOHEXOL 350 MG/ML SOLN
100.0000 mL | Freq: Once | INTRAVENOUS | Status: AC | PRN
  Administered 2021-04-10: 75 mL via INTRAVENOUS

## 2021-04-10 MED ORDER — BENZONATATE 100 MG PO CAPS: 100.0000 mg | ORAL_CAPSULE | Freq: Three times a day (TID) | ORAL | 0 refills | Status: AC

## 2021-04-10 MED ORDER — PREDNISONE 50 MG PO TABS: ORAL_TABLET | ORAL | 0 refills | Status: AC

## 2021-04-10 NOTE — ED Provider Notes (Signed)
[facility] EMERGENCY DEPT Provider Note   CSN: [id number] Arrival date & time: 04/10/21  2118     History Chief Complaint  Patient presents with   Chest Pain    Alicia Morrison is a 55 y.o. female.  55 year old female presents with cough for over a week with associated chest tightness.  Has had some increased dyspnea on exertion and some wheezing.  Diagnosed with COVID by home test 9 days ago.  Denies any vomiting or diarrhea.  No leg pain or swelling.  No treatment use prior to arrival.  Nothing major symptoms better      History reviewed. No pertinent past medical history.  Patient Active Problem List   Diagnosis Date Noted   Foreign body in ear 06/14/2014   Frequent headaches 06/14/2014   Preventive measure 08/04/2012    Past Surgical History:  Procedure Laterality Date   APPENDECTOMY     CESAREAN SECTION     tummy tuck       OB History   No obstetric history on file.     Family History  Problem Relation Age of Onset   Stroke Maternal Grandfather     Social History   Tobacco Use   Smoking status: Never   Smokeless tobacco: Never  Vaping Use   Vaping Use: Never used  Substance Use Topics   Alcohol use: Yes    Comment: occ   Drug use: No    Home Medications Prior to Admission medications   Medication Sig Start Date End Date Taking? Authorizing Provider  methocarbamol (ROBAXIN) 500 MG tablet Take 1 tablet (500 mg total) by mouth every 6 (six) hours as needed for muscle spasms. 02/19/21   Tarry Kos, MD  predniSONE (STERAPRED UNI-PAK 21 TAB) 10 MG (21) TBPK tablet Take as directed 02/19/21   Tarry Kos, MD  traMADol (ULTRAM) 50 MG tablet Take 1 tablet (50 mg total) by mouth every 6 (six) hours as needed. 08/28/17   Ward, Layla Maw, DO    Allergies    Patient has no known allergies.  Review of Systems   Review of Systems  All other systems reviewed and are negative.  Physical Exam Updated Vital Signs BP 105/75   Pulse 70    Temp 98.3 F (36.8 C) (Oral)   Resp (!) 23   Ht 1.524 m (5')   Wt 64.9 kg   LMP 03/14/2014   SpO2 97%   BMI 27.93 kg/m   Physical Exam Vitals and nursing note reviewed.  Constitutional:      General: She is not in acute distress.    Appearance: Normal appearance. She is well-developed. She is not toxic-appearing.  HENT:     Head: Normocephalic and atraumatic.  Eyes:     General: Lids are normal.     Conjunctiva/sclera: Conjunctivae normal.     Pupils: Pupils are equal, round, and reactive to light.  Neck:     Thyroid: No thyroid mass.     Trachea: No tracheal deviation.  Cardiovascular:     Rate and Rhythm: Normal rate and regular rhythm.     Heart sounds: Normal heart sounds. No murmur heard.   No gallop.  Pulmonary:     Effort: Pulmonary effort is normal. No respiratory distress.     Breath sounds: Normal breath sounds. No stridor. No decreased breath sounds, wheezing, rhonchi or rales.  Abdominal:     General: There is no distension.     Palpations: Abdomen is soft.  Tenderness: There is no abdominal tenderness. There is no rebound.  Musculoskeletal:        General: No tenderness. Normal range of motion.     Cervical back: Normal range of motion and neck supple.  Skin:    General: Skin is warm and dry.     Findings: No abrasion or rash.  Neurological:     Mental Status: She is alert and oriented to person, place, and time. Mental status is at baseline.     GCS: GCS eye subscore is 4. GCS verbal subscore is 5. GCS motor subscore is 6.     Cranial Nerves: Cranial nerves are intact. No cranial nerve deficit.     Sensory: No sensory deficit.     Motor: Motor function is intact.  Psychiatric:        Attention and Perception: Attention normal.        Speech: Speech normal.        Behavior: Behavior normal.    ED Results / Procedures / Treatments   Labs (all labs ordered are listed, but only abnormal results are displayed) Labs Reviewed  CBC - Abnormal; Notable  for the following components:      Result Value   HCT 35.7 (*)    All other components within normal limits  BASIC METABOLIC PANEL  PREGNANCY, URINE  TROPONIN I (HIGH SENSITIVITY)    EKG EKG Interpretation  Date/Time:  Thursday April 10 2021 21:26:07 EDT Ventricular Rate:  71 PR Interval:  182 QRS Duration: 70 QT Interval:  394 QTC Calculation: 428 R Axis:   36 Text Interpretation: Normal sinus rhythm Nonspecific T wave abnormality Abnormal ECG Confirmed by Lorre Nick ([id number]) on 04/10/2021 10:03:35 PM  Radiology DG Chest 2 View  Result Date: 04/10/2021 CLINICAL DATA:  Chest pain EXAM: CHEST - 2 VIEW COMPARISON:  None. FINDINGS: Lungs are clear.  No pleural effusion or pneumothorax. The heart is normal in size. Visualized osseous structures are within normal limits. IMPRESSION: Normal chest radiographs. Electronically Signed   By: Charline Bills M.D.   On: 04/10/2021 21:56    Procedures Procedures   Medications Ordered in ED Medications  iohexol (OMNIPAQUE) 350 MG/ML injection 100 mL (has no administration in time range)    ED Course  I have reviewed the triage vital signs and the nursing notes.  Pertinent labs & imaging results that were available during my care of the patient were reviewed by me and considered in my medical decision making (see chart for details).    MDM Rules/Calculators/A&P                          Labs are reassuring here Chest x-ray without acute findings here.  Suspect possibility for PE.  Will order chest CT.  11:13 PM Patient CTA chest negative for PE or pneumonia.  Labs are reassuring.  Will discharge home suspect bronchitis Final Clinical Impression(s) / ED Diagnoses Final diagnoses:  None    Rx / DC Orders ED Discharge Orders     None        Lorre Nick, MD 04/10/21 2313

## 2021-04-10 NOTE — ED Notes (Signed)
Patient transported to CT 

## 2021-04-10 NOTE — ED Triage Notes (Signed)
Pt reports chest pain and mid back pain that started yesterday - states she was tested covid +  on 08/23  - also endorses persistent cough

## 2023-01-20 ENCOUNTER — Other Ambulatory Visit (INDEPENDENT_AMBULATORY_CARE_PROVIDER_SITE_OTHER): Payer: Federal, State, Local not specified - PPO

## 2023-01-20 ENCOUNTER — Encounter: Payer: Self-pay | Admitting: Orthopaedic Surgery

## 2023-01-20 ENCOUNTER — Ambulatory Visit: Payer: Federal, State, Local not specified - PPO | Admitting: Orthopaedic Surgery

## 2023-01-20 DIAGNOSIS — M542 Cervicalgia: Secondary | ICD-10-CM

## 2023-01-20 MED ORDER — PREDNISONE 10 MG (21) PO TBPK: ORAL_TABLET | ORAL | 0 refills | Status: AC

## 2023-01-20 MED ORDER — METHOCARBAMOL 750 MG PO TABS: 750.0000 mg | ORAL_TABLET | Freq: Two times a day (BID) | ORAL | 2 refills | Status: AC | PRN

## 2023-01-20 NOTE — Progress Notes (Signed)
Office Visit Note   Patient: Alicia Morrison           Date of Birth: 31-Jan-1966           MRN: [id number] Visit Date: 01/20/2023              Requested by: [facility] [address] [place],  [place] [address] PCP: [facility]   Assessment & Plan: Visit Diagnoses:  1. Neck pain     Plan: Impression is cervical spine pain with radiation into the parascapular region.  Today, we discussed various treatment options to include steroid taper, muscle relaxer and formal physical therapy all of which she would like to proceed.  We have also discussed referral to Dr. Christell Constant should her symptoms not improved.  She will call us with any concerns or questions in the meantime.  Follow-Up Instructions: Return if symptoms worsen or fail to improve.   Orders:  Orders Placed This Encounter  Procedures   XR Cervical Spine 2 or 3 views   XR Thoracic Spine 2 View   Ambulatory referral to Physical Therapy   Meds ordered this encounter  Medications   predniSONE (STERAPRED UNI-PAK 21 TAB) 10 MG (21) TBPK tablet    Sig: Take as directed    Dispense:  21 tablet    Refill:  0   methocarbamol (ROBAXIN-750) 750 MG tablet    Sig: Take 1 tablet (750 mg total) by mouth 2 (two) times daily as needed for muscle spasms.    Dispense:  20 tablet    Refill:  2      Procedures: No procedures performed   Clinical Data: No additional findings.   Subjective: Chief Complaint  Patient presents with   Neck - Pain   Middle Back - Pain    HPI patient is a pleasant 57 year old female who comes in today with neck and parascapular pain for the past month.  No known injury or change in activity.  Pain is really described as a constant soreness worse when she is moving her neck certain directions.  She is not taking any medication for this.  She has had massages which have helped.  She also notes numbness and tingling into the entire  right hand.  Review of Systems as detailed in HPI.  All others reviewed and are negative.   Objective: Vital Signs: LMP 03/14/2014   Physical Exam well-developed well-nourished female no acute distress.  Alert and oriented x 3.  Ortho Exam cervical spine exam reveals spinous and paraspinous tenderness.  She does have tenderness in the parascapular region both sides.  Slight stiffness with range of motion of the neck.  She is neurovascularly intact distally.  No focal weakness either upper extremity.  Specialty Comments:  No specialty comments available.  Imaging: XR Cervical Spine 2 or 3 views  Result Date: 01/20/2023 X-rays demonstrate advanced multilevel degenerative changes worse C3 -C6  XR Thoracic Spine 2 View  Result Date: 01/20/2023 Mild multilevel degenerative changes    PMFS History: Patient Active Problem List   Diagnosis Date Noted   Foreign body in ear 06/14/2014   Frequent headaches 06/14/2014   Preventive measure 08/04/2012   History reviewed. No pertinent past medical history.  Family History  Problem Relation Age of Onset   Stroke Maternal Grandfather     Past Surgical History:  Procedure Laterality Date   APPENDECTOMY     CESAREAN SECTION  tummy tuck     Social History   Occupational History   Not on file  Tobacco Use   Smoking status: Never   Smokeless tobacco: Never  Vaping Use   Vaping Use: Never used  Substance and Sexual Activity   Alcohol use: Yes    Comment: occ   Drug use: No   Sexual activity: Not on file

## 2023-02-03 ENCOUNTER — Ambulatory Visit: Payer: Federal, State, Local not specified - PPO | Admitting: Physical Therapy

## 2023-02-24 ENCOUNTER — Ambulatory Visit: Payer: Federal, State, Local not specified - PPO | Admitting: Physical Therapy

## 2023-08-18 IMAGING — CT CT ANGIO CHEST
2 of 7 series · 17 of 46 positions shown · IV contrast (omnipaque)
Comparison: Chest radiograph dated 04/10/2021

CLINICAL DATA: Chest pain, COVID positive

EXAM:
CT ANGIOGRAPHY CHEST WITH CONTRAST
TECHNIQUE: Multidetector CT imaging of the chest was performed using the
standard protocol during bolus administration of intravenous
contrast. Multiplanar CT image reconstructions and MIPs were
obtained to evaluate the vascular anatomy.
CONTRAST:  75mL OMNIPAQUE IOHEXOL 350 MG/ML SOLN

[Series 5: pe axial thins · axial · 0.68mm/px · z∈[+1121,+1324]mm · 14 of 235 slices shown]
[im 16/235  lung]
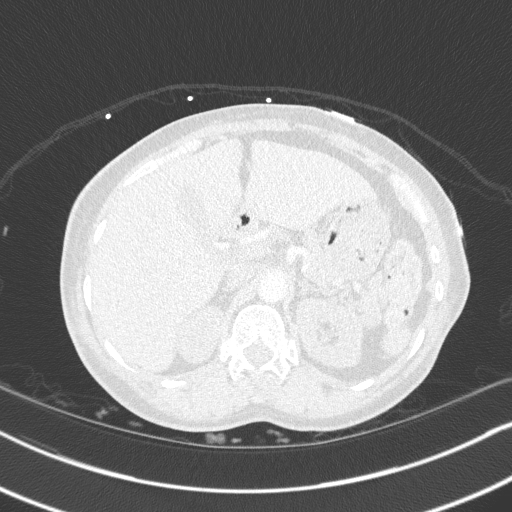
[im 32/235  soft-tissue]
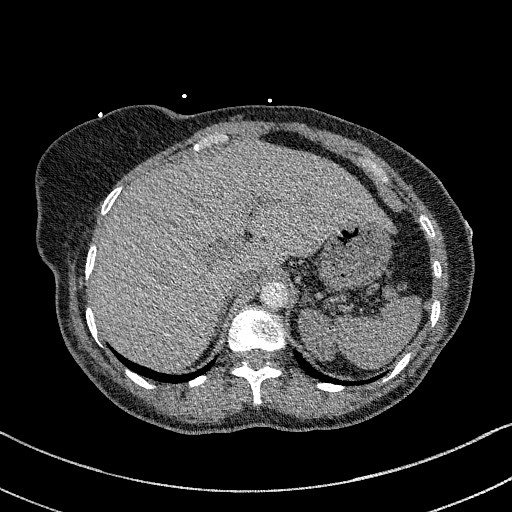
[im 47/235  lung]
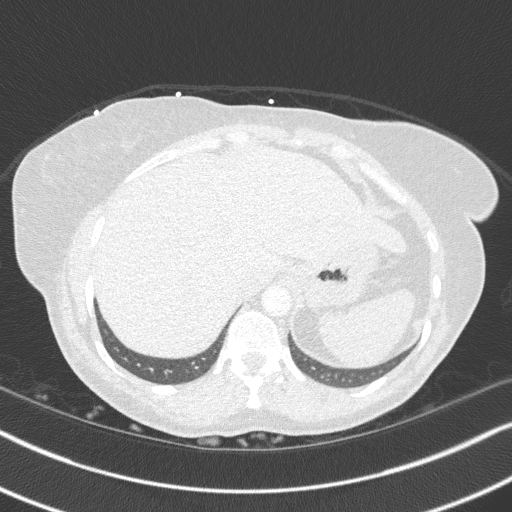
[im 63/235  soft-tissue]
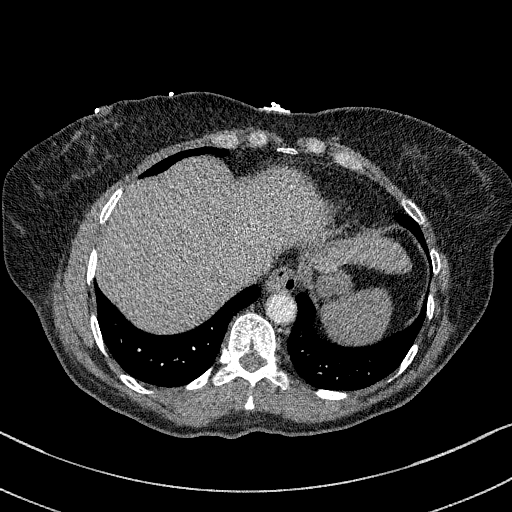
[im 79/235  lung]
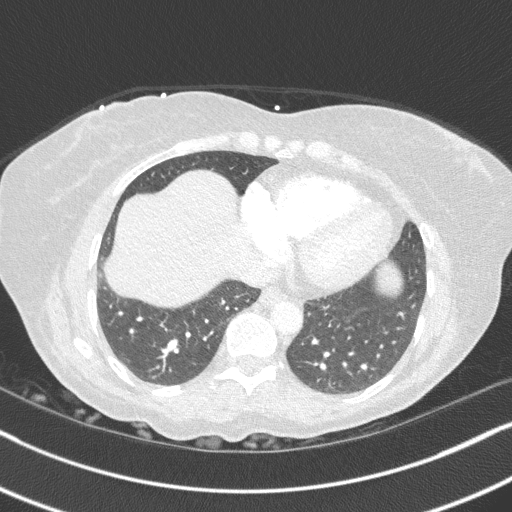
[im 94/235  soft-tissue]
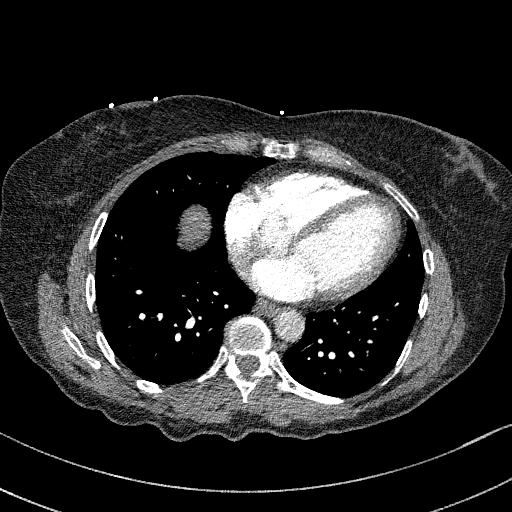
[im 110/235  lung]
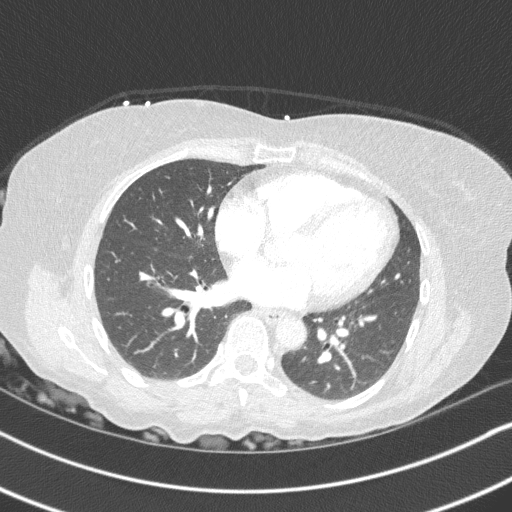
[im 125/235  soft-tissue]
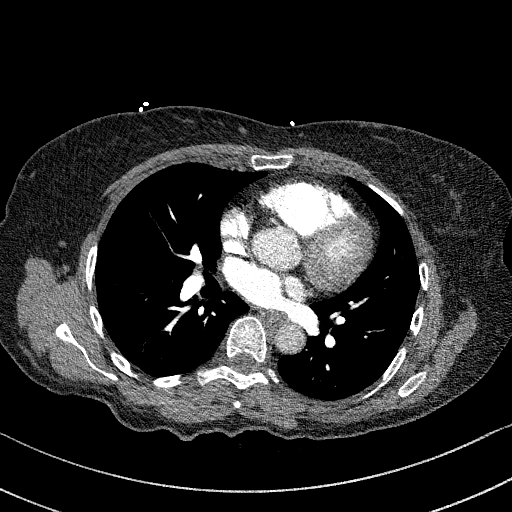
[im 141/235  lung]
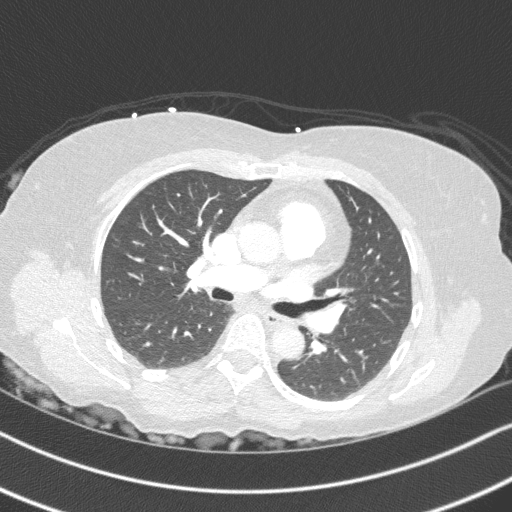
[im 157/235  soft-tissue]
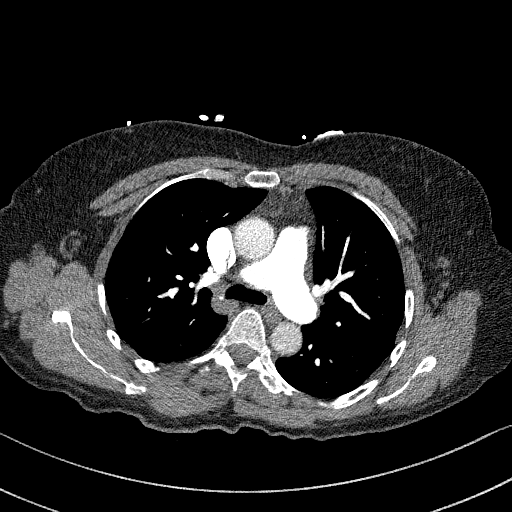
[im 172/235  lung]
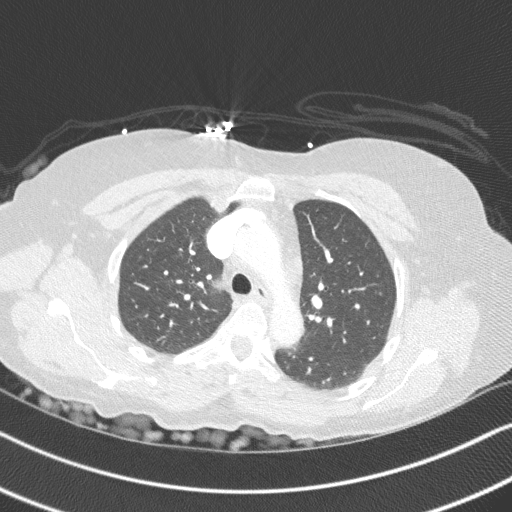
[im 188/235  soft-tissue]
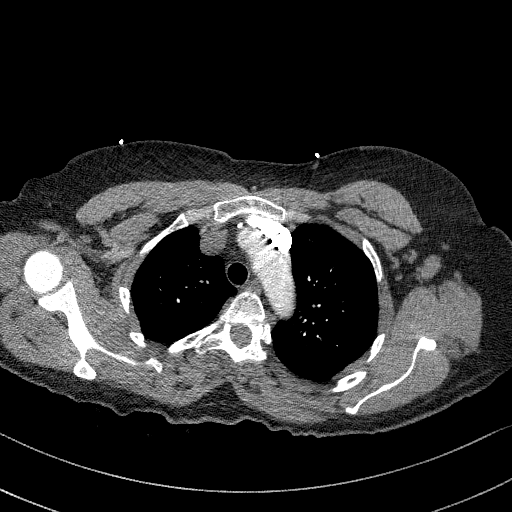
[im 203/235  lung]
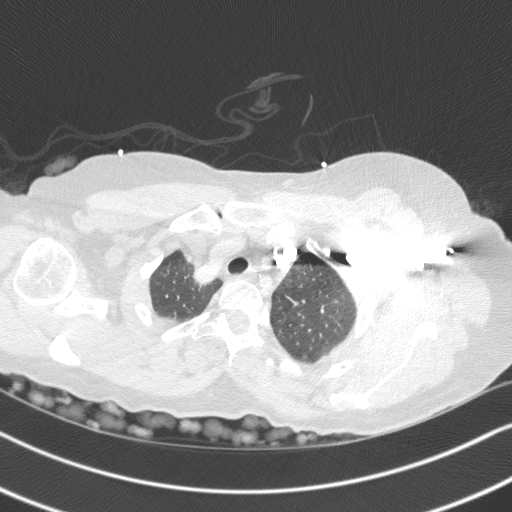
[im 219/235  soft-tissue]
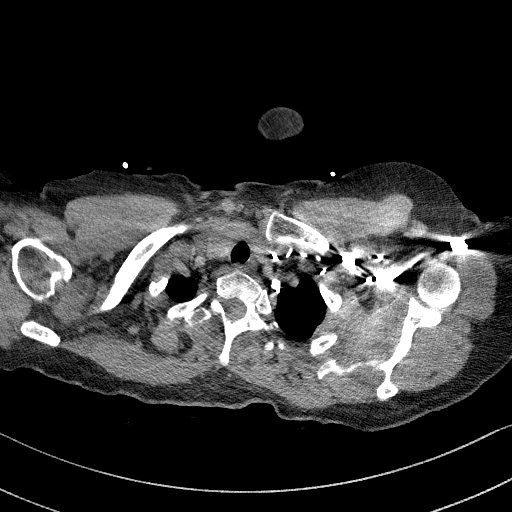

[Series 7: cor soft · coronal · 0.50mm/px · 3 of 111 slices shown]
[im 28/111  soft-tissue]
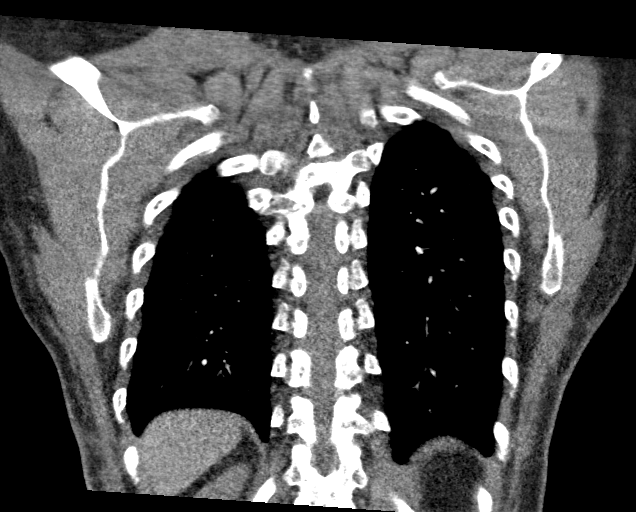
[im 56/111  soft-tissue]
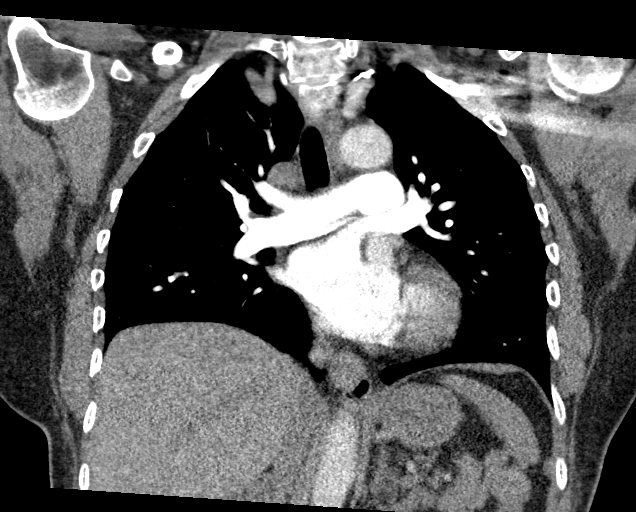
[im 83/111  soft-tissue]
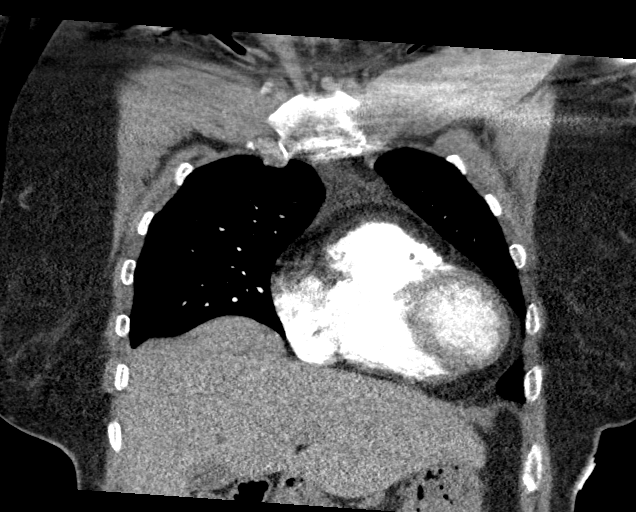

[17 of 46 positions shown; findings below may reference images not displayed]

FINDINGS: Cardiovascular: Satisfactory opacification the bilateral pulmonary
arteries to the segmental level. No evidence of pulmonary embolism.

Although not tailored for evaluation of the thoracic aorta, there is
no evidence thoracic aortic aneurysm or dissection.

Heart is normal in size.  No pericardial effusion.

Mediastinum/Nodes: No suspicious mediastinal lymphadenopathy.

Visualized thyroid is unremarkable.

Lungs/Pleura: Lungs are clear.

No suspicious pulmonary nodules.

No focal consolidation.

No pleural effusion or pneumothorax.

Upper Abdomen: Visualized upper abdomen is grossly unremarkable.

Musculoskeletal: Visualized osseous structures are within normal
limits.

Review of the MIP images confirms the above findings.
IMPRESSION: No evidence of pulmonary embolism.

Negative CT chest.
